# Patient Record
Sex: Male | Born: 1935 | Race: White | Hispanic: No | Marital: Married | Smoking: Former smoker
Health system: Southern US, Community
[De-identification: ages and names within clinical notes are randomized; demographics above are authoritative.]

## PROBLEM LIST (undated history)

## (undated) DIAGNOSIS — Z955 Presence of coronary angioplasty implant and graft: Secondary | ICD-10-CM

## (undated) HISTORY — DX: Presence of coronary angioplasty implant and graft: Z95.5

## (undated) HISTORY — PX: TONSILLECTOMY: SUR1361

## (undated) HISTORY — PX: CORONARY STENT PLACEMENT: SHX1402

---

## 2011-10-07 DIAGNOSIS — M25569 Pain in unspecified knee: Secondary | ICD-10-CM | POA: Diagnosis not present

## 2011-11-10 DIAGNOSIS — M1710 Unilateral primary osteoarthritis, unspecified knee: Secondary | ICD-10-CM | POA: Diagnosis not present

## 2011-11-10 DIAGNOSIS — S82109A Unspecified fracture of upper end of unspecified tibia, initial encounter for closed fracture: Secondary | ICD-10-CM | POA: Diagnosis not present

## 2011-11-10 DIAGNOSIS — M25569 Pain in unspecified knee: Secondary | ICD-10-CM | POA: Diagnosis not present

## 2011-11-10 DIAGNOSIS — IMO0002 Reserved for concepts with insufficient information to code with codable children: Secondary | ICD-10-CM | POA: Diagnosis not present

## 2011-11-10 DIAGNOSIS — M7120 Synovial cyst of popliteal space [Baker], unspecified knee: Secondary | ICD-10-CM | POA: Diagnosis not present

## 2011-11-11 DIAGNOSIS — S82109A Unspecified fracture of upper end of unspecified tibia, initial encounter for closed fracture: Secondary | ICD-10-CM | POA: Diagnosis not present

## 2011-12-02 DIAGNOSIS — S82109A Unspecified fracture of upper end of unspecified tibia, initial encounter for closed fracture: Secondary | ICD-10-CM | POA: Diagnosis not present

## 2012-01-05 DIAGNOSIS — M7989 Other specified soft tissue disorders: Secondary | ICD-10-CM | POA: Diagnosis not present

## 2012-01-05 DIAGNOSIS — M7120 Synovial cyst of popliteal space [Baker], unspecified knee: Secondary | ICD-10-CM | POA: Diagnosis not present

## 2012-01-05 DIAGNOSIS — M25469 Effusion, unspecified knee: Secondary | ICD-10-CM | POA: Diagnosis not present

## 2012-01-05 DIAGNOSIS — M79609 Pain in unspecified limb: Secondary | ICD-10-CM | POA: Diagnosis not present

## 2012-02-03 DIAGNOSIS — M25469 Effusion, unspecified knee: Secondary | ICD-10-CM | POA: Diagnosis not present

## 2012-02-08 DIAGNOSIS — M25569 Pain in unspecified knee: Secondary | ICD-10-CM | POA: Diagnosis not present

## 2012-03-21 DIAGNOSIS — N390 Urinary tract infection, site not specified: Secondary | ICD-10-CM | POA: Diagnosis not present

## 2012-03-21 DIAGNOSIS — N400 Enlarged prostate without lower urinary tract symptoms: Secondary | ICD-10-CM | POA: Diagnosis not present

## 2012-03-21 DIAGNOSIS — R109 Unspecified abdominal pain: Secondary | ICD-10-CM | POA: Diagnosis not present

## 2012-03-21 DIAGNOSIS — R351 Nocturia: Secondary | ICD-10-CM | POA: Diagnosis not present

## 2012-04-04 DIAGNOSIS — N411 Chronic prostatitis: Secondary | ICD-10-CM | POA: Diagnosis not present

## 2012-04-04 DIAGNOSIS — N400 Enlarged prostate without lower urinary tract symptoms: Secondary | ICD-10-CM | POA: Diagnosis not present

## 2012-04-04 DIAGNOSIS — R109 Unspecified abdominal pain: Secondary | ICD-10-CM | POA: Diagnosis not present

## 2012-04-04 DIAGNOSIS — N390 Urinary tract infection, site not specified: Secondary | ICD-10-CM | POA: Diagnosis not present

## 2012-06-22 DIAGNOSIS — K219 Gastro-esophageal reflux disease without esophagitis: Secondary | ICD-10-CM | POA: Diagnosis not present

## 2012-06-22 DIAGNOSIS — R002 Palpitations: Secondary | ICD-10-CM | POA: Diagnosis not present

## 2012-06-22 DIAGNOSIS — I2510 Atherosclerotic heart disease of native coronary artery without angina pectoris: Secondary | ICD-10-CM | POA: Diagnosis not present

## 2012-06-22 DIAGNOSIS — I200 Unstable angina: Secondary | ICD-10-CM | POA: Diagnosis not present

## 2012-06-22 DIAGNOSIS — Z87891 Personal history of nicotine dependence: Secondary | ICD-10-CM | POA: Diagnosis not present

## 2012-06-22 DIAGNOSIS — R079 Chest pain, unspecified: Secondary | ICD-10-CM | POA: Diagnosis not present

## 2012-06-22 DIAGNOSIS — E7800 Pure hypercholesterolemia, unspecified: Secondary | ICD-10-CM | POA: Diagnosis not present

## 2012-06-22 DIAGNOSIS — I209 Angina pectoris, unspecified: Secondary | ICD-10-CM | POA: Diagnosis not present

## 2012-06-22 DIAGNOSIS — N289 Disorder of kidney and ureter, unspecified: Secondary | ICD-10-CM | POA: Diagnosis not present

## 2012-06-22 DIAGNOSIS — I498 Other specified cardiac arrhythmias: Secondary | ICD-10-CM | POA: Diagnosis not present

## 2012-06-22 DIAGNOSIS — N182 Chronic kidney disease, stage 2 (mild): Secondary | ICD-10-CM | POA: Diagnosis not present

## 2012-06-23 DIAGNOSIS — I129 Hypertensive chronic kidney disease with stage 1 through stage 4 chronic kidney disease, or unspecified chronic kidney disease: Secondary | ICD-10-CM | POA: Diagnosis not present

## 2012-06-23 DIAGNOSIS — I2510 Atherosclerotic heart disease of native coronary artery without angina pectoris: Secondary | ICD-10-CM | POA: Diagnosis not present

## 2012-06-23 DIAGNOSIS — I200 Unstable angina: Secondary | ICD-10-CM | POA: Diagnosis not present

## 2012-06-23 DIAGNOSIS — Z79899 Other long term (current) drug therapy: Secondary | ICD-10-CM | POA: Diagnosis not present

## 2012-06-23 DIAGNOSIS — Z9889 Other specified postprocedural states: Secondary | ICD-10-CM | POA: Diagnosis not present

## 2012-06-23 DIAGNOSIS — N182 Chronic kidney disease, stage 2 (mild): Secondary | ICD-10-CM | POA: Diagnosis not present

## 2012-06-23 DIAGNOSIS — R079 Chest pain, unspecified: Secondary | ICD-10-CM | POA: Diagnosis not present

## 2012-06-23 DIAGNOSIS — R0789 Other chest pain: Secondary | ICD-10-CM | POA: Diagnosis not present

## 2012-06-23 DIAGNOSIS — N289 Disorder of kidney and ureter, unspecified: Secondary | ICD-10-CM | POA: Diagnosis not present

## 2012-06-23 DIAGNOSIS — K219 Gastro-esophageal reflux disease without esophagitis: Secondary | ICD-10-CM | POA: Diagnosis not present

## 2012-06-23 DIAGNOSIS — E7800 Pure hypercholesterolemia, unspecified: Secondary | ICD-10-CM | POA: Diagnosis not present

## 2012-06-23 DIAGNOSIS — I498 Other specified cardiac arrhythmias: Secondary | ICD-10-CM | POA: Diagnosis not present

## 2012-06-23 DIAGNOSIS — I495 Sick sinus syndrome: Secondary | ICD-10-CM | POA: Diagnosis not present

## 2012-06-23 DIAGNOSIS — Z87891 Personal history of nicotine dependence: Secondary | ICD-10-CM | POA: Diagnosis not present

## 2012-06-23 DIAGNOSIS — I209 Angina pectoris, unspecified: Secondary | ICD-10-CM | POA: Diagnosis not present

## 2012-06-23 DIAGNOSIS — R9431 Abnormal electrocardiogram [ECG] [EKG]: Secondary | ICD-10-CM | POA: Diagnosis not present

## 2012-06-23 DIAGNOSIS — Z7982 Long term (current) use of aspirin: Secondary | ICD-10-CM | POA: Diagnosis not present

## 2012-06-23 DIAGNOSIS — N179 Acute kidney failure, unspecified: Secondary | ICD-10-CM | POA: Diagnosis not present

## 2012-06-23 DIAGNOSIS — R002 Palpitations: Secondary | ICD-10-CM | POA: Diagnosis not present

## 2012-06-24 DIAGNOSIS — R9431 Abnormal electrocardiogram [ECG] [EKG]: Secondary | ICD-10-CM | POA: Diagnosis not present

## 2012-06-26 DIAGNOSIS — I129 Hypertensive chronic kidney disease with stage 1 through stage 4 chronic kidney disease, or unspecified chronic kidney disease: Secondary | ICD-10-CM | POA: Diagnosis not present

## 2012-06-26 DIAGNOSIS — Z7982 Long term (current) use of aspirin: Secondary | ICD-10-CM | POA: Diagnosis not present

## 2012-06-26 DIAGNOSIS — I2510 Atherosclerotic heart disease of native coronary artery without angina pectoris: Secondary | ICD-10-CM | POA: Diagnosis not present

## 2012-06-26 DIAGNOSIS — I209 Angina pectoris, unspecified: Secondary | ICD-10-CM | POA: Diagnosis not present

## 2012-06-26 DIAGNOSIS — N182 Chronic kidney disease, stage 2 (mild): Secondary | ICD-10-CM | POA: Diagnosis not present

## 2012-06-26 DIAGNOSIS — K219 Gastro-esophageal reflux disease without esophagitis: Secondary | ICD-10-CM | POA: Diagnosis not present

## 2012-06-26 DIAGNOSIS — I200 Unstable angina: Secondary | ICD-10-CM | POA: Diagnosis not present

## 2012-06-26 DIAGNOSIS — Z9889 Other specified postprocedural states: Secondary | ICD-10-CM | POA: Diagnosis not present

## 2012-06-26 DIAGNOSIS — Z79899 Other long term (current) drug therapy: Secondary | ICD-10-CM | POA: Diagnosis not present

## 2012-06-27 DIAGNOSIS — I129 Hypertensive chronic kidney disease with stage 1 through stage 4 chronic kidney disease, or unspecified chronic kidney disease: Secondary | ICD-10-CM | POA: Diagnosis not present

## 2012-06-27 DIAGNOSIS — I2510 Atherosclerotic heart disease of native coronary artery without angina pectoris: Secondary | ICD-10-CM | POA: Diagnosis not present

## 2012-06-27 DIAGNOSIS — N182 Chronic kidney disease, stage 2 (mild): Secondary | ICD-10-CM | POA: Diagnosis not present

## 2012-06-27 DIAGNOSIS — K219 Gastro-esophageal reflux disease without esophagitis: Secondary | ICD-10-CM | POA: Diagnosis not present

## 2012-06-27 DIAGNOSIS — I208 Other forms of angina pectoris: Secondary | ICD-10-CM | POA: Diagnosis not present

## 2012-06-27 DIAGNOSIS — I209 Angina pectoris, unspecified: Secondary | ICD-10-CM | POA: Diagnosis not present

## 2012-06-27 DIAGNOSIS — Z79899 Other long term (current) drug therapy: Secondary | ICD-10-CM | POA: Diagnosis not present

## 2012-06-27 DIAGNOSIS — Z9861 Coronary angioplasty status: Secondary | ICD-10-CM | POA: Diagnosis not present

## 2012-07-10 DIAGNOSIS — I2510 Atherosclerotic heart disease of native coronary artery without angina pectoris: Secondary | ICD-10-CM | POA: Diagnosis not present

## 2012-07-10 DIAGNOSIS — K219 Gastro-esophageal reflux disease without esophagitis: Secondary | ICD-10-CM | POA: Diagnosis not present

## 2012-07-10 DIAGNOSIS — H1045 Other chronic allergic conjunctivitis: Secondary | ICD-10-CM | POA: Diagnosis not present

## 2012-07-10 DIAGNOSIS — I200 Unstable angina: Secondary | ICD-10-CM | POA: Diagnosis not present

## 2012-07-10 DIAGNOSIS — R0602 Shortness of breath: Secondary | ICD-10-CM | POA: Diagnosis not present

## 2012-07-10 DIAGNOSIS — Z8679 Personal history of other diseases of the circulatory system: Secondary | ICD-10-CM | POA: Diagnosis not present

## 2012-08-07 DIAGNOSIS — E785 Hyperlipidemia, unspecified: Secondary | ICD-10-CM | POA: Diagnosis not present

## 2012-11-17 DIAGNOSIS — N509 Disorder of male genital organs, unspecified: Secondary | ICD-10-CM | POA: Diagnosis not present

## 2012-11-17 DIAGNOSIS — N411 Chronic prostatitis: Secondary | ICD-10-CM | POA: Diagnosis not present

## 2012-11-17 DIAGNOSIS — N390 Urinary tract infection, site not specified: Secondary | ICD-10-CM | POA: Diagnosis not present

## 2012-12-01 DIAGNOSIS — N390 Urinary tract infection, site not specified: Secondary | ICD-10-CM | POA: Diagnosis not present

## 2012-12-01 DIAGNOSIS — N411 Chronic prostatitis: Secondary | ICD-10-CM | POA: Diagnosis not present

## 2012-12-01 DIAGNOSIS — R109 Unspecified abdominal pain: Secondary | ICD-10-CM | POA: Diagnosis not present

## 2013-05-24 DIAGNOSIS — M4712 Other spondylosis with myelopathy, cervical region: Secondary | ICD-10-CM | POA: Diagnosis not present

## 2013-07-12 DIAGNOSIS — E785 Hyperlipidemia, unspecified: Secondary | ICD-10-CM | POA: Diagnosis not present

## 2013-07-12 DIAGNOSIS — R0602 Shortness of breath: Secondary | ICD-10-CM | POA: Diagnosis not present

## 2013-07-12 DIAGNOSIS — Z87898 Personal history of other specified conditions: Secondary | ICD-10-CM | POA: Diagnosis not present

## 2013-07-12 DIAGNOSIS — I2510 Atherosclerotic heart disease of native coronary artery without angina pectoris: Secondary | ICD-10-CM | POA: Diagnosis not present

## 2013-07-12 DIAGNOSIS — R918 Other nonspecific abnormal finding of lung field: Secondary | ICD-10-CM | POA: Diagnosis not present

## 2013-07-13 DIAGNOSIS — I2510 Atherosclerotic heart disease of native coronary artery without angina pectoris: Secondary | ICD-10-CM | POA: Diagnosis not present

## 2013-08-07 DIAGNOSIS — I2510 Atherosclerotic heart disease of native coronary artery without angina pectoris: Secondary | ICD-10-CM | POA: Diagnosis not present

## 2013-08-07 DIAGNOSIS — R0602 Shortness of breath: Secondary | ICD-10-CM | POA: Diagnosis not present

## 2013-08-29 DIAGNOSIS — H2510 Age-related nuclear cataract, unspecified eye: Secondary | ICD-10-CM | POA: Diagnosis not present

## 2013-09-14 DIAGNOSIS — N411 Chronic prostatitis: Secondary | ICD-10-CM | POA: Diagnosis not present

## 2013-09-14 DIAGNOSIS — R351 Nocturia: Secondary | ICD-10-CM | POA: Diagnosis not present

## 2013-09-14 DIAGNOSIS — N390 Urinary tract infection, site not specified: Secondary | ICD-10-CM | POA: Diagnosis not present

## 2013-12-25 DIAGNOSIS — H04129 Dry eye syndrome of unspecified lacrimal gland: Secondary | ICD-10-CM | POA: Diagnosis not present

## 2013-12-25 DIAGNOSIS — H2589 Other age-related cataract: Secondary | ICD-10-CM | POA: Diagnosis not present

## 2014-01-15 DIAGNOSIS — H268 Other specified cataract: Secondary | ICD-10-CM | POA: Diagnosis not present

## 2014-01-15 DIAGNOSIS — Z7902 Long term (current) use of antithrombotics/antiplatelets: Secondary | ICD-10-CM | POA: Diagnosis not present

## 2014-01-15 DIAGNOSIS — I2510 Atherosclerotic heart disease of native coronary artery without angina pectoris: Secondary | ICD-10-CM | POA: Diagnosis not present

## 2014-01-15 DIAGNOSIS — Z9861 Coronary angioplasty status: Secondary | ICD-10-CM | POA: Diagnosis not present

## 2014-01-15 DIAGNOSIS — I252 Old myocardial infarction: Secondary | ICD-10-CM | POA: Diagnosis not present

## 2014-01-15 DIAGNOSIS — Z7982 Long term (current) use of aspirin: Secondary | ICD-10-CM | POA: Diagnosis not present

## 2014-01-15 DIAGNOSIS — H2510 Age-related nuclear cataract, unspecified eye: Secondary | ICD-10-CM | POA: Diagnosis not present

## 2014-01-15 DIAGNOSIS — H2589 Other age-related cataract: Secondary | ICD-10-CM | POA: Diagnosis not present

## 2014-01-15 DIAGNOSIS — H269 Unspecified cataract: Secondary | ICD-10-CM | POA: Diagnosis not present

## 2014-01-15 DIAGNOSIS — Z87891 Personal history of nicotine dependence: Secondary | ICD-10-CM | POA: Diagnosis not present

## 2014-01-15 DIAGNOSIS — I10 Essential (primary) hypertension: Secondary | ICD-10-CM | POA: Diagnosis not present

## 2014-01-15 DIAGNOSIS — Z79899 Other long term (current) drug therapy: Secondary | ICD-10-CM | POA: Diagnosis not present

## 2014-01-22 DIAGNOSIS — H04129 Dry eye syndrome of unspecified lacrimal gland: Secondary | ICD-10-CM | POA: Diagnosis not present

## 2014-02-19 DIAGNOSIS — H259 Unspecified age-related cataract: Secondary | ICD-10-CM | POA: Diagnosis not present

## 2014-02-19 DIAGNOSIS — I2510 Atherosclerotic heart disease of native coronary artery without angina pectoris: Secondary | ICD-10-CM | POA: Diagnosis not present

## 2014-02-19 DIAGNOSIS — Z79899 Other long term (current) drug therapy: Secondary | ICD-10-CM | POA: Diagnosis not present

## 2014-02-19 DIAGNOSIS — Z7902 Long term (current) use of antithrombotics/antiplatelets: Secondary | ICD-10-CM | POA: Diagnosis not present

## 2014-02-19 DIAGNOSIS — Z7982 Long term (current) use of aspirin: Secondary | ICD-10-CM | POA: Diagnosis not present

## 2014-02-19 DIAGNOSIS — H2589 Other age-related cataract: Secondary | ICD-10-CM | POA: Diagnosis not present

## 2014-02-19 DIAGNOSIS — H2510 Age-related nuclear cataract, unspecified eye: Secondary | ICD-10-CM | POA: Diagnosis not present

## 2014-02-19 DIAGNOSIS — H9190 Unspecified hearing loss, unspecified ear: Secondary | ICD-10-CM | POA: Diagnosis not present

## 2014-02-19 DIAGNOSIS — Z9861 Coronary angioplasty status: Secondary | ICD-10-CM | POA: Diagnosis not present

## 2014-03-26 DIAGNOSIS — I2510 Atherosclerotic heart disease of native coronary artery without angina pectoris: Secondary | ICD-10-CM | POA: Diagnosis not present

## 2014-03-26 DIAGNOSIS — E785 Hyperlipidemia, unspecified: Secondary | ICD-10-CM | POA: Diagnosis not present

## 2014-08-27 DIAGNOSIS — Z961 Presence of intraocular lens: Secondary | ICD-10-CM | POA: Diagnosis not present

## 2014-08-27 DIAGNOSIS — H10412 Chronic giant papillary conjunctivitis, left eye: Secondary | ICD-10-CM | POA: Diagnosis not present

## 2014-09-16 DIAGNOSIS — N3090 Cystitis, unspecified without hematuria: Secondary | ICD-10-CM | POA: Diagnosis not present

## 2014-09-16 DIAGNOSIS — N318 Other neuromuscular dysfunction of bladder: Secondary | ICD-10-CM | POA: Diagnosis not present

## 2014-09-16 DIAGNOSIS — N400 Enlarged prostate without lower urinary tract symptoms: Secondary | ICD-10-CM | POA: Diagnosis not present

## 2015-01-21 DIAGNOSIS — R070 Pain in throat: Secondary | ICD-10-CM | POA: Diagnosis not present

## 2015-08-07 DIAGNOSIS — I25119 Atherosclerotic heart disease of native coronary artery with unspecified angina pectoris: Secondary | ICD-10-CM | POA: Diagnosis not present

## 2015-08-07 DIAGNOSIS — E785 Hyperlipidemia, unspecified: Secondary | ICD-10-CM | POA: Diagnosis not present

## 2015-08-07 DIAGNOSIS — R001 Bradycardia, unspecified: Secondary | ICD-10-CM | POA: Diagnosis not present

## 2015-08-14 DIAGNOSIS — L989 Disorder of the skin and subcutaneous tissue, unspecified: Secondary | ICD-10-CM | POA: Diagnosis not present

## 2015-08-14 DIAGNOSIS — J029 Acute pharyngitis, unspecified: Secondary | ICD-10-CM | POA: Diagnosis not present

## 2015-08-18 DIAGNOSIS — C44319 Basal cell carcinoma of skin of other parts of face: Secondary | ICD-10-CM | POA: Diagnosis not present

## 2015-08-22 DIAGNOSIS — C44319 Basal cell carcinoma of skin of other parts of face: Secondary | ICD-10-CM | POA: Diagnosis not present

## 2015-08-22 DIAGNOSIS — L578 Other skin changes due to chronic exposure to nonionizing radiation: Secondary | ICD-10-CM | POA: Diagnosis not present

## 2015-08-22 DIAGNOSIS — L570 Actinic keratosis: Secondary | ICD-10-CM | POA: Diagnosis not present

## 2015-09-01 DIAGNOSIS — J029 Acute pharyngitis, unspecified: Secondary | ICD-10-CM | POA: Diagnosis not present

## 2015-09-01 DIAGNOSIS — K219 Gastro-esophageal reflux disease without esophagitis: Secondary | ICD-10-CM | POA: Diagnosis not present

## 2015-09-16 DIAGNOSIS — N3020 Other chronic cystitis without hematuria: Secondary | ICD-10-CM | POA: Diagnosis not present

## 2015-09-16 DIAGNOSIS — N400 Enlarged prostate without lower urinary tract symptoms: Secondary | ICD-10-CM | POA: Diagnosis not present

## 2015-09-16 DIAGNOSIS — N318 Other neuromuscular dysfunction of bladder: Secondary | ICD-10-CM | POA: Diagnosis not present

## 2015-09-16 DIAGNOSIS — N401 Enlarged prostate with lower urinary tract symptoms: Secondary | ICD-10-CM | POA: Diagnosis not present

## 2015-10-29 DIAGNOSIS — C44329 Squamous cell carcinoma of skin of other parts of face: Secondary | ICD-10-CM | POA: Diagnosis not present

## 2016-03-08 DIAGNOSIS — C44119 Basal cell carcinoma of skin of left eyelid, including canthus: Secondary | ICD-10-CM | POA: Diagnosis not present

## 2016-06-28 DIAGNOSIS — Z955 Presence of coronary angioplasty implant and graft: Secondary | ICD-10-CM | POA: Diagnosis not present

## 2016-06-28 DIAGNOSIS — E785 Hyperlipidemia, unspecified: Secondary | ICD-10-CM | POA: Diagnosis not present

## 2016-06-28 DIAGNOSIS — I2510 Atherosclerotic heart disease of native coronary artery without angina pectoris: Secondary | ICD-10-CM | POA: Diagnosis not present

## 2016-06-28 DIAGNOSIS — R9431 Abnormal electrocardiogram [ECG] [EKG]: Secondary | ICD-10-CM | POA: Diagnosis not present

## 2016-06-28 DIAGNOSIS — R5383 Other fatigue: Secondary | ICD-10-CM | POA: Diagnosis not present

## 2016-06-28 DIAGNOSIS — R0602 Shortness of breath: Secondary | ICD-10-CM | POA: Diagnosis not present

## 2016-06-28 DIAGNOSIS — I429 Cardiomyopathy, unspecified: Secondary | ICD-10-CM | POA: Diagnosis not present

## 2016-06-28 DIAGNOSIS — Z8249 Family history of ischemic heart disease and other diseases of the circulatory system: Secondary | ICD-10-CM | POA: Diagnosis not present

## 2016-06-28 DIAGNOSIS — I739 Peripheral vascular disease, unspecified: Secondary | ICD-10-CM | POA: Diagnosis not present

## 2016-06-28 DIAGNOSIS — R600 Localized edema: Secondary | ICD-10-CM | POA: Diagnosis not present

## 2016-06-29 DIAGNOSIS — R600 Localized edema: Secondary | ICD-10-CM | POA: Diagnosis not present

## 2016-06-29 DIAGNOSIS — Z955 Presence of coronary angioplasty implant and graft: Secondary | ICD-10-CM | POA: Diagnosis not present

## 2016-06-29 DIAGNOSIS — I739 Peripheral vascular disease, unspecified: Secondary | ICD-10-CM | POA: Diagnosis not present

## 2016-06-29 DIAGNOSIS — R9431 Abnormal electrocardiogram [ECG] [EKG]: Secondary | ICD-10-CM | POA: Diagnosis not present

## 2016-06-29 DIAGNOSIS — I429 Cardiomyopathy, unspecified: Secondary | ICD-10-CM | POA: Diagnosis not present

## 2016-06-29 DIAGNOSIS — E785 Hyperlipidemia, unspecified: Secondary | ICD-10-CM | POA: Diagnosis not present

## 2016-06-29 DIAGNOSIS — Z8249 Family history of ischemic heart disease and other diseases of the circulatory system: Secondary | ICD-10-CM | POA: Diagnosis not present

## 2016-06-29 DIAGNOSIS — R0602 Shortness of breath: Secondary | ICD-10-CM | POA: Diagnosis not present

## 2016-06-29 DIAGNOSIS — I2510 Atherosclerotic heart disease of native coronary artery without angina pectoris: Secondary | ICD-10-CM | POA: Diagnosis not present

## 2016-06-29 DIAGNOSIS — R5383 Other fatigue: Secondary | ICD-10-CM | POA: Diagnosis not present

## 2016-07-02 DIAGNOSIS — R062 Wheezing: Secondary | ICD-10-CM | POA: Diagnosis not present

## 2016-07-02 DIAGNOSIS — R05 Cough: Secondary | ICD-10-CM | POA: Diagnosis not present

## 2016-07-02 DIAGNOSIS — I429 Cardiomyopathy, unspecified: Secondary | ICD-10-CM | POA: Diagnosis not present

## 2016-07-02 DIAGNOSIS — R5383 Other fatigue: Secondary | ICD-10-CM | POA: Diagnosis not present

## 2016-07-02 DIAGNOSIS — R9431 Abnormal electrocardiogram [ECG] [EKG]: Secondary | ICD-10-CM | POA: Diagnosis not present

## 2016-07-02 DIAGNOSIS — R931 Abnormal findings on diagnostic imaging of heart and coronary circulation: Secondary | ICD-10-CM | POA: Diagnosis not present

## 2016-07-02 DIAGNOSIS — N289 Disorder of kidney and ureter, unspecified: Secondary | ICD-10-CM | POA: Diagnosis not present

## 2016-07-02 DIAGNOSIS — Z955 Presence of coronary angioplasty implant and graft: Secondary | ICD-10-CM | POA: Diagnosis not present

## 2016-07-02 DIAGNOSIS — R0609 Other forms of dyspnea: Secondary | ICD-10-CM | POA: Diagnosis not present

## 2016-07-02 DIAGNOSIS — E785 Hyperlipidemia, unspecified: Secondary | ICD-10-CM | POA: Diagnosis not present

## 2016-07-02 DIAGNOSIS — I2510 Atherosclerotic heart disease of native coronary artery without angina pectoris: Secondary | ICD-10-CM | POA: Diagnosis not present

## 2016-07-02 DIAGNOSIS — Z8249 Family history of ischemic heart disease and other diseases of the circulatory system: Secondary | ICD-10-CM | POA: Diagnosis not present

## 2016-07-16 DIAGNOSIS — Z1389 Encounter for screening for other disorder: Secondary | ICD-10-CM | POA: Diagnosis not present

## 2016-07-16 DIAGNOSIS — R0602 Shortness of breath: Secondary | ICD-10-CM | POA: Diagnosis not present

## 2016-07-16 DIAGNOSIS — I7300 Raynaud's syndrome without gangrene: Secondary | ICD-10-CM | POA: Diagnosis not present

## 2016-07-16 DIAGNOSIS — R05 Cough: Secondary | ICD-10-CM | POA: Diagnosis not present

## 2016-07-16 DIAGNOSIS — J8410 Pulmonary fibrosis, unspecified: Secondary | ICD-10-CM | POA: Diagnosis not present

## 2016-08-30 DIAGNOSIS — R931 Abnormal findings on diagnostic imaging of heart and coronary circulation: Secondary | ICD-10-CM | POA: Diagnosis not present

## 2016-08-30 DIAGNOSIS — I2510 Atherosclerotic heart disease of native coronary artery without angina pectoris: Secondary | ICD-10-CM | POA: Diagnosis not present

## 2016-08-30 DIAGNOSIS — R5383 Other fatigue: Secondary | ICD-10-CM | POA: Diagnosis not present

## 2016-08-30 DIAGNOSIS — R9431 Abnormal electrocardiogram [ECG] [EKG]: Secondary | ICD-10-CM | POA: Diagnosis not present

## 2016-08-30 DIAGNOSIS — R0609 Other forms of dyspnea: Secondary | ICD-10-CM | POA: Diagnosis not present

## 2016-08-30 DIAGNOSIS — I429 Cardiomyopathy, unspecified: Secondary | ICD-10-CM | POA: Diagnosis not present

## 2016-08-30 DIAGNOSIS — Z955 Presence of coronary angioplasty implant and graft: Secondary | ICD-10-CM | POA: Diagnosis not present

## 2016-09-10 DIAGNOSIS — J8410 Pulmonary fibrosis, unspecified: Secondary | ICD-10-CM | POA: Diagnosis not present

## 2016-09-10 DIAGNOSIS — M26622 Arthralgia of left temporomandibular joint: Secondary | ICD-10-CM | POA: Diagnosis not present

## 2016-09-10 DIAGNOSIS — I509 Heart failure, unspecified: Secondary | ICD-10-CM | POA: Diagnosis not present

## 2016-09-10 DIAGNOSIS — M4722 Other spondylosis with radiculopathy, cervical region: Secondary | ICD-10-CM | POA: Diagnosis not present

## 2016-09-14 DIAGNOSIS — N401 Enlarged prostate with lower urinary tract symptoms: Secondary | ICD-10-CM | POA: Diagnosis not present

## 2016-09-14 DIAGNOSIS — N3020 Other chronic cystitis without hematuria: Secondary | ICD-10-CM | POA: Diagnosis not present

## 2016-09-14 DIAGNOSIS — R351 Nocturia: Secondary | ICD-10-CM | POA: Diagnosis not present

## 2016-09-14 DIAGNOSIS — N318 Other neuromuscular dysfunction of bladder: Secondary | ICD-10-CM | POA: Diagnosis not present

## 2016-10-07 DIAGNOSIS — L821 Other seborrheic keratosis: Secondary | ICD-10-CM | POA: Diagnosis not present

## 2016-10-07 DIAGNOSIS — L814 Other melanin hyperpigmentation: Secondary | ICD-10-CM | POA: Diagnosis not present

## 2016-10-07 DIAGNOSIS — L570 Actinic keratosis: Secondary | ICD-10-CM | POA: Diagnosis not present

## 2017-01-03 DIAGNOSIS — H10412 Chronic giant papillary conjunctivitis, left eye: Secondary | ICD-10-CM | POA: Diagnosis not present

## 2017-03-01 DIAGNOSIS — N3020 Other chronic cystitis without hematuria: Secondary | ICD-10-CM | POA: Diagnosis not present

## 2017-03-01 DIAGNOSIS — N410 Acute prostatitis: Secondary | ICD-10-CM | POA: Diagnosis not present

## 2017-03-15 DIAGNOSIS — N3020 Other chronic cystitis without hematuria: Secondary | ICD-10-CM | POA: Diagnosis not present

## 2017-03-15 DIAGNOSIS — N410 Acute prostatitis: Secondary | ICD-10-CM | POA: Diagnosis not present

## 2017-05-18 DIAGNOSIS — I429 Cardiomyopathy, unspecified: Secondary | ICD-10-CM | POA: Diagnosis not present

## 2017-05-18 DIAGNOSIS — I2510 Atherosclerotic heart disease of native coronary artery without angina pectoris: Secondary | ICD-10-CM | POA: Diagnosis not present

## 2017-05-18 DIAGNOSIS — J929 Pleural plaque without asbestos: Secondary | ICD-10-CM | POA: Diagnosis not present

## 2017-05-18 DIAGNOSIS — R0989 Other specified symptoms and signs involving the circulatory and respiratory systems: Secondary | ICD-10-CM | POA: Diagnosis not present

## 2017-05-18 DIAGNOSIS — R0602 Shortness of breath: Secondary | ICD-10-CM | POA: Diagnosis not present

## 2017-05-18 DIAGNOSIS — R05 Cough: Secondary | ICD-10-CM | POA: Diagnosis not present

## 2017-05-23 DIAGNOSIS — Z1331 Encounter for screening for depression: Secondary | ICD-10-CM | POA: Diagnosis not present

## 2017-05-23 DIAGNOSIS — Z0000 Encounter for general adult medical examination without abnormal findings: Secondary | ICD-10-CM | POA: Diagnosis not present

## 2017-05-23 DIAGNOSIS — R0609 Other forms of dyspnea: Secondary | ICD-10-CM | POA: Diagnosis not present

## 2017-05-23 DIAGNOSIS — Z6825 Body mass index (BMI) 25.0-25.9, adult: Secondary | ICD-10-CM | POA: Diagnosis not present

## 2017-05-23 LAB — PULMONARY FUNCTION TEST

## 2017-06-06 DIAGNOSIS — J984 Other disorders of lung: Secondary | ICD-10-CM | POA: Diagnosis not present

## 2017-06-06 DIAGNOSIS — Z6825 Body mass index (BMI) 25.0-25.9, adult: Secondary | ICD-10-CM | POA: Diagnosis not present

## 2017-06-06 DIAGNOSIS — J449 Chronic obstructive pulmonary disease, unspecified: Secondary | ICD-10-CM | POA: Diagnosis not present

## 2017-06-06 LAB — PULMONARY FUNCTION TEST

## 2017-06-24 ENCOUNTER — Other Ambulatory Visit (INDEPENDENT_AMBULATORY_CARE_PROVIDER_SITE_OTHER): Payer: Medicare Other

## 2017-06-24 ENCOUNTER — Encounter: Payer: Self-pay | Admitting: Pulmonary Disease

## 2017-06-24 ENCOUNTER — Ambulatory Visit (INDEPENDENT_AMBULATORY_CARE_PROVIDER_SITE_OTHER): Payer: Medicare Other | Admitting: Pulmonary Disease

## 2017-06-24 DIAGNOSIS — J849 Interstitial pulmonary disease, unspecified: Secondary | ICD-10-CM

## 2017-06-24 LAB — CBC WITH DIFFERENTIAL/PLATELET
Basophils Absolute: 0 10*3/uL (ref 0.0–0.1)
Basophils Relative: 0.4 % (ref 0.0–3.0)
EOS ABS: 0.2 10*3/uL (ref 0.0–0.7)
Eosinophils Relative: 2.6 % (ref 0.0–5.0)
HCT: 40.4 % (ref 39.0–52.0)
HEMOGLOBIN: 13.5 g/dL (ref 13.0–17.0)
Lymphocytes Relative: 24.1 % (ref 12.0–46.0)
Lymphs Abs: 2 10*3/uL (ref 0.7–4.0)
MCHC: 33.3 g/dL (ref 30.0–36.0)
MCV: 89 fl (ref 78.0–100.0)
MONO ABS: 0.6 10*3/uL (ref 0.1–1.0)
Monocytes Relative: 7.6 % (ref 3.0–12.0)
Neutro Abs: 5.6 10*3/uL (ref 1.4–7.7)
Neutrophils Relative %: 65.3 % (ref 43.0–77.0)
Platelets: 193 10*3/uL (ref 150.0–400.0)
RBC: 4.54 Mil/uL (ref 4.22–5.81)
RDW: 13.8 % (ref 11.5–15.5)
WBC: 8.5 10*3/uL (ref 4.0–10.5)

## 2017-06-24 LAB — COMPREHENSIVE METABOLIC PANEL
ALK PHOS: 64 U/L (ref 39–117)
ALT: 11 U/L (ref 0–53)
AST: 18 U/L (ref 0–37)
Albumin: 4.4 g/dL (ref 3.5–5.2)
BUN: 27 mg/dL — AB (ref 6–23)
CO2: 33 mEq/L — ABNORMAL HIGH (ref 19–32)
CREATININE: 1.58 mg/dL — AB (ref 0.40–1.50)
Calcium: 9.8 mg/dL (ref 8.4–10.5)
Chloride: 102 mEq/L (ref 96–112)
GFR: 44.23 mL/min — ABNORMAL LOW (ref >60.00)
Glucose, Bld: 109 mg/dL — ABNORMAL HIGH (ref 70–99)
Potassium: 4.1 mEq/L (ref 3.5–5.1)
SODIUM: 142 meq/L (ref 135–145)
TOTAL PROTEIN: 7.4 g/dL (ref 6.0–8.3)
Total Bilirubin: 0.4 mg/dL (ref 0.2–1.2)

## 2017-06-24 LAB — SEDIMENTATION RATE: Sed Rate: 5 mm/hr (ref 0–20)

## 2017-06-24 NOTE — Patient Instructions (Signed)
Will try to get copy of breathing tests from Dr. Bary Leriche office  Lab tests today  Will schedule CT chest  Follow up in 2 to 3 weeks with Dr. Halford Chessman or Nurse Practioner

## 2017-06-24 NOTE — Progress Notes (Signed)
[facility]  Chief Complaint  Patient presents with  . Advice Only    Patient has been experiencing SOB since november. 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 Referred by PCP, Dr. Nicki Reaper. He also has a productive cough(clear) and chest pain when working in dust.     Vital signs: BP 114/80 (BP Location: Left Arm, Cuff Size: Normal)   Pulse 88   Ht 6\' 1"  (1.854 m)   Wt 202 lb (91.6 kg)   SpO2 98%   BMI 26.65 kg/m   History of Present Illness: Victor Barry is a 84 y.o. male former smoker with dyspnea and cough.  He has noticed trouble with a cough for past several months.  This has been associated with a cough with clear sputum.  He had CXR report from 05/18/17 showed pulmonary fibrosis.  He reports having a breathing test and was told his lung function was low.  He works on a farm.  He notices more cough when around dust.  He was started on advair, and feels this helps.  He quit smoking years ago.  He thinks he had asbestos exposure from insulation years ago.  He denies history of pneumonia or exposure to tuberculosis.    He denies fever, skin rash, joint swelling.  He has never been told he had asthma, or COPD.   Physical Exam:  General - pleasant Eyes - pupils reactive ENT - no sinus tenderness, no oral exudate, no LAN Cardiac - regular, no murmur Chest - faint basilar crackles b/l Abd - soft, non tender Ext - no edema Skin - dry skin Neuro - normal strength Psych - normal mood  Discussion:  84 year old male with progressive cough and dyspnea.  His recent chest xray shows changes suggestive of pulmonary fibrosis.  He has possible exposures from farm work.  He reports remote history of smoking and possible asbestos exposure.  Assessment/Plan:  Interstitial lung disease. - will arrange for lab testing today - will arrange for high resolution CT chest - will get copy of his recent breathing tests - he can continue  advair for now   Patient Instructions  Will try to get copy of breathing tests from Dr. Bary Leriche office  Lab tests today  Will schedule CT chest  Follow up in 2 to 3 weeks with Dr. Halford Chessman or Nurse Practioner    Chesley Mires, MD [place] 06/24/2017, 2:29 PM Pager:  [phone]  Flow Sheet  Pulmonary tests:  Cardiac tests:  Review of Systems: Short of breath, cough, sore throat, headaches, swelling.  Remainder of 12 point ROS negative.  Past Medical History: He  has a past medical history of Coronary stent patent.  Past Surgical History: He  has a past surgical history that includes Tonsillectomy and Coronary stent placement.  Family History: His is negative for interstitial lung disease.  Social History: He  reports that he quit smoking about 39 years ago. His smoking use included cigarettes. He has a 25.00 pack-year smoking history. he has never used smokeless tobacco. He reports that he does not drink alcohol or use drugs.  Medications: Allergies as of 06/24/2017   No Known Allergies     Medication List        Accurate as of 06/24/17  2:29 PM. Always use your most recent med list.          aspirin EC 81 MG tablet Take 81 mg by mouth daily.   clopidogrel  75 MG tablet Commonly known as:  PLAVIX Take 75 mg by mouth daily.   esomeprazole 40 MG capsule Commonly known as:  NEXIUM Take 40 mg by mouth daily at 12 noon.   Fluticasone-Salmeterol 250-50 MCG/DOSE Aepb Commonly known as:  ADVAIR Inhale 1 puff into the lungs 2 (two) times daily.

## 2017-06-25 LAB — ANA W/REFLEX IF POSITIVE: Anti Nuclear Antibody(ANA): NEGATIVE

## 2017-06-30 LAB — JO-1 ANTIBODY-IGG: Jo-1 Autoabs: <1 AI

## 2017-06-30 LAB — HYPERSENSITIVITY PNUEMONITIS PROFILE
ASPERGILLUS FUMIGATUS: NEGATIVE
FAENIA RETIVIRGULA: NEGATIVE
Pigeon Serum: NEGATIVE
S. VIRIDIS: NEGATIVE
T. CANDIDUS: NEGATIVE
T. VULGARIS: NEGATIVE

## 2017-06-30 LAB — ANTI-SCLERODERMA ANTIBODY: Scleroderma (Scl-70) (ENA) Antibody, IgG: <1 AI

## 2017-06-30 LAB — SJOGREN'S SYNDROME ANTIBODS(SSA + SSB)
SSA (Ro) (ENA) Antibody, IgG: <1 AI
SSB (La) (ENA) Antibody, IgG: <1 AI

## 2017-06-30 LAB — RHEUMATOID FACTOR

## 2017-07-06 ENCOUNTER — Ambulatory Visit (INDEPENDENT_AMBULATORY_CARE_PROVIDER_SITE_OTHER)
Admission: RE | Admit: 2017-07-06 | Discharge: 2017-07-06 | Disposition: A | Discharge status: Home / Self Care | Payer: Medicare Other | Source: Ambulatory Visit | Attending: Pulmonary Disease | Admitting: Pulmonary Disease

## 2017-07-06 DIAGNOSIS — R918 Other nonspecific abnormal finding of lung field: Secondary | ICD-10-CM | POA: Diagnosis not present

## 2017-07-06 DIAGNOSIS — J849 Interstitial pulmonary disease, unspecified: Secondary | ICD-10-CM

## 2017-07-16 ENCOUNTER — Encounter: Payer: Self-pay | Admitting: Pulmonary Disease

## 2017-07-16 DIAGNOSIS — J849 Interstitial pulmonary disease, unspecified: Secondary | ICD-10-CM | POA: Insufficient documentation

## 2017-07-29 ENCOUNTER — Ambulatory Visit (INDEPENDENT_AMBULATORY_CARE_PROVIDER_SITE_OTHER): Payer: Medicare Other | Admitting: Pulmonary Disease

## 2017-07-29 ENCOUNTER — Encounter: Payer: Self-pay | Admitting: Pulmonary Disease

## 2017-07-29 VITALS — BP 124/74 | HR 59 | Ht 73.0 in | Wt 203.0 lb

## 2017-07-29 DIAGNOSIS — J84112 Idiopathic pulmonary fibrosis: Secondary | ICD-10-CM | POA: Diagnosis not present

## 2017-07-29 NOTE — Patient Instructions (Signed)
Follow up in 3 months

## 2017-07-29 NOTE — Progress Notes (Signed)
[facility]  Chief Complaint  Patient presents with  . Follow-up    Pt would like the results of labs and CT scan    Vital signs: BP 124/74 (BP Location: Left Arm, Cuff Size: Normal)   Pulse (!) 59   Ht 6\' 1"  (1.854 m)   Wt 203 lb (92.1 kg)   SpO2 94%   BMI 26.78 kg/m   History of Present Illness: Victor Barry is a 84 y.o. male former smoker with pulmonary fibrosis.  He is here to review his labs and CT chest.  Labs were negative.  CT chest consistent with IPF.  He uses advair and feels this helps his cough.  He gets winded with too much exertion.  No having skin rash, fever, sputum, chest pain, hemoptysis, or leg swelling.   Physical Exam:  General - pleasant Eyes - pupils reactive ENT - no sinus tenderness, no oral exudate, no LAN Cardiac - regular, no murmur Chest - faint basilar rales Abd - soft, non tender Ext - no edema Skin - no rashes Neuro - normal strength Psych - normal mood   Assessment/Plan:  Idiopathic pulmonary fibrosis. - reviewed CT chest and labs with him - discussed natural progression of disease - review anti-fibrotic therapy options - he does not want to pursue specific medical therapy or additional pulmonary function testing for his IPF at this time - okay to continue advair since he has symptomatic benefit  Time spent 27 minutes  Patient Instructions  Follow up in 3 months    Chesley Mires, MD [place] 07/29/2017, 11:23 AM Pager:  [phone]  Flow Sheet  Pulmonary tests: Spirometry 06/06/17 >> FEV1 1.99 (64%), FEV1% 68 Serology 06/24/17 >> SCL 70, anti Jo, SSA/SSB, RF, ANA all negative HRCT chest 07/06/17 >> subpleural reticulation, traction BTC, early honeycombing, scattered nodules up to 5 mm  Cardiac tests:  Past Medical History: He  has a past medical history of Coronary stent patent.  Past Surgical History: He  has a past surgical history that includes  Tonsillectomy and Coronary stent placement.  Family History: His is negative for interstitial lung disease.  Social History: He  reports that he quit smoking about 39 years ago. His smoking use included cigarettes. He has a 25.00 pack-year smoking history. he has never used smokeless tobacco. He reports that he does not drink alcohol or use drugs.  Medications: Allergies as of 07/29/2017   No Known Allergies     Medication List        Accurate as of 07/29/17 11:23 AM. Always use your most recent med list.          aspirin EC 81 MG tablet Take 81 mg by mouth daily.   clopidogrel 75 MG tablet Commonly known as:  PLAVIX Take 75 mg by mouth daily.   esomeprazole 40 MG capsule Commonly known as:  NEXIUM Take 40 mg by mouth daily at 12 noon.   Fluticasone-Salmeterol 250-50 MCG/DOSE Aepb Commonly known as:  ADVAIR Inhale 1 puff into the lungs 2 (two) times daily.

## 2017-09-15 DIAGNOSIS — C44311 Basal cell carcinoma of skin of nose: Secondary | ICD-10-CM | POA: Diagnosis not present

## 2017-09-15 DIAGNOSIS — C44329 Squamous cell carcinoma of skin of other parts of face: Secondary | ICD-10-CM | POA: Diagnosis not present

## 2017-10-11 DIAGNOSIS — L570 Actinic keratosis: Secondary | ICD-10-CM | POA: Diagnosis not present

## 2017-10-28 ENCOUNTER — Ambulatory Visit: Payer: Medicare Other | Admitting: Pulmonary Disease

## 2017-11-02 DIAGNOSIS — Z0389 Encounter for observation for other suspected diseases and conditions ruled out: Secondary | ICD-10-CM | POA: Diagnosis not present

## 2017-11-02 DIAGNOSIS — E876 Hypokalemia: Secondary | ICD-10-CM | POA: Diagnosis present

## 2017-11-02 DIAGNOSIS — Z66 Do not resuscitate: Secondary | ICD-10-CM | POA: Diagnosis present

## 2017-11-02 DIAGNOSIS — J9601 Acute respiratory failure with hypoxia: Secondary | ICD-10-CM | POA: Diagnosis present

## 2017-11-02 DIAGNOSIS — R918 Other nonspecific abnormal finding of lung field: Secondary | ICD-10-CM | POA: Diagnosis present

## 2017-11-02 DIAGNOSIS — Z7951 Long term (current) use of inhaled steroids: Secondary | ICD-10-CM | POA: Diagnosis not present

## 2017-11-02 DIAGNOSIS — N183 Chronic kidney disease, stage 3 (moderate): Secondary | ICD-10-CM | POA: Diagnosis not present

## 2017-11-02 DIAGNOSIS — Z955 Presence of coronary angioplasty implant and graft: Secondary | ICD-10-CM | POA: Diagnosis not present

## 2017-11-02 DIAGNOSIS — J189 Pneumonia, unspecified organism: Secondary | ICD-10-CM | POA: Diagnosis not present

## 2017-11-02 DIAGNOSIS — Z87891 Personal history of nicotine dependence: Secondary | ICD-10-CM | POA: Diagnosis not present

## 2017-11-02 DIAGNOSIS — E785 Hyperlipidemia, unspecified: Secondary | ICD-10-CM | POA: Diagnosis present

## 2017-11-02 DIAGNOSIS — Z7982 Long term (current) use of aspirin: Secondary | ICD-10-CM | POA: Diagnosis not present

## 2017-11-02 DIAGNOSIS — E871 Hypo-osmolality and hyponatremia: Secondary | ICD-10-CM | POA: Diagnosis not present

## 2017-11-02 DIAGNOSIS — R0902 Hypoxemia: Secondary | ICD-10-CM | POA: Diagnosis not present

## 2017-11-02 DIAGNOSIS — I2510 Atherosclerotic heart disease of native coronary artery without angina pectoris: Secondary | ICD-10-CM | POA: Diagnosis present

## 2017-11-02 DIAGNOSIS — J84112 Idiopathic pulmonary fibrosis: Secondary | ICD-10-CM | POA: Diagnosis present

## 2017-11-16 DIAGNOSIS — I2699 Other pulmonary embolism without acute cor pulmonale: Secondary | ICD-10-CM | POA: Diagnosis not present

## 2017-11-16 DIAGNOSIS — R0902 Hypoxemia: Secondary | ICD-10-CM | POA: Diagnosis not present

## 2017-11-16 DIAGNOSIS — I429 Cardiomyopathy, unspecified: Secondary | ICD-10-CM | POA: Diagnosis not present

## 2017-11-16 DIAGNOSIS — E871 Hypo-osmolality and hyponatremia: Secondary | ICD-10-CM | POA: Diagnosis not present

## 2017-11-16 DIAGNOSIS — R0602 Shortness of breath: Secondary | ICD-10-CM | POA: Diagnosis not present

## 2017-11-16 DIAGNOSIS — E785 Hyperlipidemia, unspecified: Secondary | ICD-10-CM | POA: Diagnosis not present

## 2017-11-16 DIAGNOSIS — I517 Cardiomegaly: Secondary | ICD-10-CM | POA: Diagnosis not present

## 2017-11-16 DIAGNOSIS — Z7902 Long term (current) use of antithrombotics/antiplatelets: Secondary | ICD-10-CM | POA: Diagnosis not present

## 2017-11-16 DIAGNOSIS — Z87891 Personal history of nicotine dependence: Secondary | ICD-10-CM | POA: Diagnosis not present

## 2017-11-16 DIAGNOSIS — K219 Gastro-esophageal reflux disease without esophagitis: Secondary | ICD-10-CM | POA: Diagnosis not present

## 2017-11-16 DIAGNOSIS — Z79899 Other long term (current) drug therapy: Secondary | ICD-10-CM | POA: Diagnosis not present

## 2017-11-16 DIAGNOSIS — N183 Chronic kidney disease, stage 3 (moderate): Secondary | ICD-10-CM | POA: Diagnosis not present

## 2017-11-16 DIAGNOSIS — J189 Pneumonia, unspecified organism: Secondary | ICD-10-CM | POA: Diagnosis not present

## 2017-11-16 DIAGNOSIS — K7689 Other specified diseases of liver: Secondary | ICD-10-CM | POA: Diagnosis not present

## 2017-11-16 DIAGNOSIS — R0609 Other forms of dyspnea: Secondary | ICD-10-CM | POA: Diagnosis not present

## 2017-11-16 DIAGNOSIS — J9601 Acute respiratory failure with hypoxia: Secondary | ICD-10-CM | POA: Diagnosis not present

## 2017-11-16 DIAGNOSIS — R5383 Other fatigue: Secondary | ICD-10-CM | POA: Diagnosis not present

## 2017-11-16 DIAGNOSIS — I2510 Atherosclerotic heart disease of native coronary artery without angina pectoris: Secondary | ICD-10-CM | POA: Diagnosis not present

## 2017-11-16 DIAGNOSIS — Z7982 Long term (current) use of aspirin: Secondary | ICD-10-CM | POA: Diagnosis not present

## 2017-11-16 DIAGNOSIS — I739 Peripheral vascular disease, unspecified: Secondary | ICD-10-CM | POA: Diagnosis not present

## 2017-11-16 DIAGNOSIS — Z955 Presence of coronary angioplasty implant and graft: Secondary | ICD-10-CM | POA: Diagnosis not present

## 2017-11-17 DIAGNOSIS — I2699 Other pulmonary embolism without acute cor pulmonale: Secondary | ICD-10-CM | POA: Diagnosis not present

## 2017-11-23 DIAGNOSIS — I2510 Atherosclerotic heart disease of native coronary artery without angina pectoris: Secondary | ICD-10-CM | POA: Diagnosis not present

## 2017-11-23 DIAGNOSIS — Z1339 Encounter for screening examination for other mental health and behavioral disorders: Secondary | ICD-10-CM | POA: Diagnosis not present

## 2017-11-23 DIAGNOSIS — R0609 Other forms of dyspnea: Secondary | ICD-10-CM | POA: Diagnosis not present

## 2017-11-23 DIAGNOSIS — I519 Heart disease, unspecified: Secondary | ICD-10-CM | POA: Diagnosis not present

## 2017-11-23 DIAGNOSIS — R931 Abnormal findings on diagnostic imaging of heart and coronary circulation: Secondary | ICD-10-CM | POA: Diagnosis not present

## 2017-11-23 DIAGNOSIS — R9431 Abnormal electrocardiogram [ECG] [EKG]: Secondary | ICD-10-CM | POA: Diagnosis not present

## 2017-11-23 DIAGNOSIS — I2699 Other pulmonary embolism without acute cor pulmonale: Secondary | ICD-10-CM | POA: Diagnosis not present

## 2017-11-23 DIAGNOSIS — J84112 Idiopathic pulmonary fibrosis: Secondary | ICD-10-CM | POA: Diagnosis not present

## 2017-11-23 DIAGNOSIS — R5383 Other fatigue: Secondary | ICD-10-CM | POA: Diagnosis not present

## 2017-11-23 DIAGNOSIS — J8410 Pulmonary fibrosis, unspecified: Secondary | ICD-10-CM | POA: Diagnosis not present

## 2017-11-23 DIAGNOSIS — J439 Emphysema, unspecified: Secondary | ICD-10-CM | POA: Diagnosis not present

## 2017-12-12 DIAGNOSIS — J8410 Pulmonary fibrosis, unspecified: Secondary | ICD-10-CM | POA: Diagnosis not present

## 2017-12-12 DIAGNOSIS — J310 Chronic rhinitis: Secondary | ICD-10-CM | POA: Diagnosis not present

## 2017-12-29 DIAGNOSIS — J8410 Pulmonary fibrosis, unspecified: Secondary | ICD-10-CM | POA: Diagnosis not present

## 2017-12-29 DIAGNOSIS — I2510 Atherosclerotic heart disease of native coronary artery without angina pectoris: Secondary | ICD-10-CM | POA: Diagnosis not present

## 2018-02-02 DIAGNOSIS — J84112 Idiopathic pulmonary fibrosis: Secondary | ICD-10-CM | POA: Diagnosis not present

## 2018-02-02 DIAGNOSIS — I249 Acute ischemic heart disease, unspecified: Secondary | ICD-10-CM | POA: Diagnosis not present

## 2018-02-02 DIAGNOSIS — I509 Heart failure, unspecified: Secondary | ICD-10-CM | POA: Diagnosis not present

## 2018-02-22 DIAGNOSIS — Z23 Encounter for immunization: Secondary | ICD-10-CM | POA: Diagnosis not present

## 2018-02-22 DIAGNOSIS — I2699 Other pulmonary embolism without acute cor pulmonale: Secondary | ICD-10-CM | POA: Diagnosis not present

## 2018-02-22 DIAGNOSIS — B370 Candidal stomatitis: Secondary | ICD-10-CM | POA: Diagnosis not present

## 2018-02-22 DIAGNOSIS — Z6825 Body mass index (BMI) 25.0-25.9, adult: Secondary | ICD-10-CM | POA: Diagnosis not present

## 2018-03-06 DIAGNOSIS — N183 Chronic kidney disease, stage 3 (moderate): Secondary | ICD-10-CM | POA: Diagnosis not present

## 2018-03-06 DIAGNOSIS — J84112 Idiopathic pulmonary fibrosis: Secondary | ICD-10-CM | POA: Diagnosis not present

## 2018-03-06 DIAGNOSIS — K219 Gastro-esophageal reflux disease without esophagitis: Secondary | ICD-10-CM | POA: Diagnosis not present

## 2018-03-06 DIAGNOSIS — I509 Heart failure, unspecified: Secondary | ICD-10-CM | POA: Diagnosis not present

## 2018-03-22 DIAGNOSIS — Z6826 Body mass index (BMI) 26.0-26.9, adult: Secondary | ICD-10-CM | POA: Diagnosis not present

## 2018-03-22 DIAGNOSIS — R030 Elevated blood-pressure reading, without diagnosis of hypertension: Secondary | ICD-10-CM | POA: Diagnosis not present

## 2018-03-22 DIAGNOSIS — H9392 Unspecified disorder of left ear: Secondary | ICD-10-CM | POA: Diagnosis not present

## 2018-04-06 DIAGNOSIS — K219 Gastro-esophageal reflux disease without esophagitis: Secondary | ICD-10-CM | POA: Diagnosis not present

## 2018-04-06 DIAGNOSIS — I509 Heart failure, unspecified: Secondary | ICD-10-CM | POA: Diagnosis not present

## 2018-05-10 DIAGNOSIS — R5383 Other fatigue: Secondary | ICD-10-CM | POA: Diagnosis not present

## 2018-05-10 DIAGNOSIS — I519 Heart disease, unspecified: Secondary | ICD-10-CM | POA: Diagnosis not present

## 2018-05-10 DIAGNOSIS — R9431 Abnormal electrocardiogram [ECG] [EKG]: Secondary | ICD-10-CM | POA: Diagnosis not present

## 2018-05-10 DIAGNOSIS — R931 Abnormal findings on diagnostic imaging of heart and coronary circulation: Secondary | ICD-10-CM | POA: Diagnosis not present

## 2018-05-10 DIAGNOSIS — R0609 Other forms of dyspnea: Secondary | ICD-10-CM | POA: Diagnosis not present

## 2018-05-10 DIAGNOSIS — I2510 Atherosclerotic heart disease of native coronary artery without angina pectoris: Secondary | ICD-10-CM | POA: Diagnosis not present

## 2018-05-10 DIAGNOSIS — J8410 Pulmonary fibrosis, unspecified: Secondary | ICD-10-CM | POA: Diagnosis not present

## 2018-05-10 DIAGNOSIS — I2699 Other pulmonary embolism without acute cor pulmonale: Secondary | ICD-10-CM | POA: Diagnosis not present

## 2018-05-10 DIAGNOSIS — J439 Emphysema, unspecified: Secondary | ICD-10-CM | POA: Diagnosis not present

## 2018-05-24 DIAGNOSIS — Z79899 Other long term (current) drug therapy: Secondary | ICD-10-CM | POA: Diagnosis not present

## 2018-05-24 DIAGNOSIS — Z0000 Encounter for general adult medical examination without abnormal findings: Secondary | ICD-10-CM | POA: Diagnosis not present

## 2018-05-24 DIAGNOSIS — J84112 Idiopathic pulmonary fibrosis: Secondary | ICD-10-CM | POA: Diagnosis not present

## 2018-05-24 DIAGNOSIS — I509 Heart failure, unspecified: Secondary | ICD-10-CM | POA: Diagnosis not present

## 2018-05-24 DIAGNOSIS — N183 Chronic kidney disease, stage 3 (moderate): Secondary | ICD-10-CM | POA: Diagnosis not present

## 2018-08-05 DIAGNOSIS — N183 Chronic kidney disease, stage 3 (moderate): Secondary | ICD-10-CM | POA: Diagnosis not present

## 2018-08-05 DIAGNOSIS — J84112 Idiopathic pulmonary fibrosis: Secondary | ICD-10-CM | POA: Diagnosis not present

## 2018-11-27 DIAGNOSIS — R911 Solitary pulmonary nodule: Secondary | ICD-10-CM | POA: Diagnosis not present

## 2018-11-27 DIAGNOSIS — I2510 Atherosclerotic heart disease of native coronary artery without angina pectoris: Secondary | ICD-10-CM | POA: Diagnosis not present

## 2018-11-27 DIAGNOSIS — E785 Hyperlipidemia, unspecified: Secondary | ICD-10-CM | POA: Diagnosis not present

## 2018-11-27 DIAGNOSIS — J439 Emphysema, unspecified: Secondary | ICD-10-CM | POA: Diagnosis not present

## 2018-11-27 DIAGNOSIS — I517 Cardiomegaly: Secondary | ICD-10-CM | POA: Diagnosis not present

## 2018-11-27 DIAGNOSIS — Z86711 Personal history of pulmonary embolism: Secondary | ICD-10-CM | POA: Diagnosis not present

## 2018-11-27 DIAGNOSIS — R9389 Abnormal findings on diagnostic imaging of other specified body structures: Secondary | ICD-10-CM | POA: Diagnosis not present

## 2018-11-27 DIAGNOSIS — Z87891 Personal history of nicotine dependence: Secondary | ICD-10-CM | POA: Diagnosis not present

## 2018-11-27 DIAGNOSIS — R0609 Other forms of dyspnea: Secondary | ICD-10-CM | POA: Diagnosis not present

## 2018-11-27 DIAGNOSIS — I519 Heart disease, unspecified: Secondary | ICD-10-CM | POA: Diagnosis not present

## 2018-11-27 DIAGNOSIS — J8410 Pulmonary fibrosis, unspecified: Secondary | ICD-10-CM | POA: Diagnosis not present

## 2018-11-27 DIAGNOSIS — I429 Cardiomyopathy, unspecified: Secondary | ICD-10-CM | POA: Diagnosis not present

## 2018-11-27 DIAGNOSIS — R0602 Shortness of breath: Secondary | ICD-10-CM | POA: Diagnosis not present

## 2018-11-27 DIAGNOSIS — R5383 Other fatigue: Secondary | ICD-10-CM | POA: Diagnosis not present

## 2018-11-27 DIAGNOSIS — R600 Localized edema: Secondary | ICD-10-CM | POA: Diagnosis not present

## 2018-11-27 DIAGNOSIS — Z8249 Family history of ischemic heart disease and other diseases of the circulatory system: Secondary | ICD-10-CM | POA: Diagnosis not present

## 2018-12-08 DIAGNOSIS — Z8249 Family history of ischemic heart disease and other diseases of the circulatory system: Secondary | ICD-10-CM | POA: Diagnosis not present

## 2018-12-08 DIAGNOSIS — R5383 Other fatigue: Secondary | ICD-10-CM | POA: Diagnosis not present

## 2018-12-08 DIAGNOSIS — J8410 Pulmonary fibrosis, unspecified: Secondary | ICD-10-CM | POA: Diagnosis not present

## 2018-12-08 DIAGNOSIS — R0609 Other forms of dyspnea: Secondary | ICD-10-CM | POA: Diagnosis not present

## 2018-12-08 DIAGNOSIS — I519 Heart disease, unspecified: Secondary | ICD-10-CM | POA: Diagnosis not present

## 2018-12-08 DIAGNOSIS — R0602 Shortness of breath: Secondary | ICD-10-CM | POA: Diagnosis not present

## 2018-12-08 DIAGNOSIS — E785 Hyperlipidemia, unspecified: Secondary | ICD-10-CM | POA: Diagnosis not present

## 2018-12-08 DIAGNOSIS — I429 Cardiomyopathy, unspecified: Secondary | ICD-10-CM | POA: Diagnosis not present

## 2018-12-08 DIAGNOSIS — Z87891 Personal history of nicotine dependence: Secondary | ICD-10-CM | POA: Diagnosis not present

## 2018-12-08 DIAGNOSIS — R600 Localized edema: Secondary | ICD-10-CM | POA: Diagnosis not present

## 2018-12-08 DIAGNOSIS — Z86711 Personal history of pulmonary embolism: Secondary | ICD-10-CM | POA: Diagnosis not present

## 2018-12-08 DIAGNOSIS — I2510 Atherosclerotic heart disease of native coronary artery without angina pectoris: Secondary | ICD-10-CM | POA: Diagnosis not present

## 2018-12-26 DIAGNOSIS — E785 Hyperlipidemia, unspecified: Secondary | ICD-10-CM | POA: Diagnosis not present

## 2018-12-26 DIAGNOSIS — I429 Cardiomyopathy, unspecified: Secondary | ICD-10-CM | POA: Diagnosis not present

## 2018-12-26 DIAGNOSIS — Z8249 Family history of ischemic heart disease and other diseases of the circulatory system: Secondary | ICD-10-CM | POA: Diagnosis not present

## 2018-12-26 DIAGNOSIS — Z86711 Personal history of pulmonary embolism: Secondary | ICD-10-CM | POA: Diagnosis not present

## 2018-12-26 DIAGNOSIS — J8410 Pulmonary fibrosis, unspecified: Secondary | ICD-10-CM | POA: Diagnosis not present

## 2018-12-26 DIAGNOSIS — Z87891 Personal history of nicotine dependence: Secondary | ICD-10-CM | POA: Diagnosis not present

## 2018-12-26 DIAGNOSIS — I2510 Atherosclerotic heart disease of native coronary artery without angina pectoris: Secondary | ICD-10-CM | POA: Diagnosis not present

## 2018-12-26 DIAGNOSIS — J439 Emphysema, unspecified: Secondary | ICD-10-CM | POA: Diagnosis not present

## 2018-12-26 DIAGNOSIS — I519 Heart disease, unspecified: Secondary | ICD-10-CM | POA: Diagnosis not present

## 2018-12-26 DIAGNOSIS — R0609 Other forms of dyspnea: Secondary | ICD-10-CM | POA: Diagnosis not present

## 2018-12-26 DIAGNOSIS — R600 Localized edema: Secondary | ICD-10-CM | POA: Diagnosis not present

## 2018-12-26 DIAGNOSIS — R5383 Other fatigue: Secondary | ICD-10-CM | POA: Diagnosis not present

## 2019-01-05 ENCOUNTER — Other Ambulatory Visit: Payer: Self-pay

## 2019-01-23 DIAGNOSIS — J84112 Idiopathic pulmonary fibrosis: Secondary | ICD-10-CM | POA: Diagnosis not present

## 2019-01-23 DIAGNOSIS — J449 Chronic obstructive pulmonary disease, unspecified: Secondary | ICD-10-CM | POA: Diagnosis not present

## 2019-03-07 DIAGNOSIS — N183 Chronic kidney disease, stage 3 (moderate): Secondary | ICD-10-CM | POA: Diagnosis not present

## 2019-03-07 DIAGNOSIS — J84112 Idiopathic pulmonary fibrosis: Secondary | ICD-10-CM | POA: Diagnosis not present

## 2019-05-02 ENCOUNTER — Other Ambulatory Visit: Payer: Self-pay

## 2019-05-21 DIAGNOSIS — R05 Cough: Secondary | ICD-10-CM | POA: Diagnosis not present

## 2019-05-21 DIAGNOSIS — J8410 Pulmonary fibrosis, unspecified: Secondary | ICD-10-CM | POA: Diagnosis not present

## 2019-05-21 DIAGNOSIS — R0600 Dyspnea, unspecified: Secondary | ICD-10-CM | POA: Diagnosis not present

## 2019-05-21 DIAGNOSIS — I519 Heart disease, unspecified: Secondary | ICD-10-CM | POA: Diagnosis not present

## 2019-05-21 DIAGNOSIS — I429 Cardiomyopathy, unspecified: Secondary | ICD-10-CM | POA: Diagnosis not present

## 2019-05-21 DIAGNOSIS — Z8249 Family history of ischemic heart disease and other diseases of the circulatory system: Secondary | ICD-10-CM | POA: Diagnosis not present

## 2019-05-21 DIAGNOSIS — R5383 Other fatigue: Secondary | ICD-10-CM | POA: Diagnosis not present

## 2019-05-21 DIAGNOSIS — Z86711 Personal history of pulmonary embolism: Secondary | ICD-10-CM | POA: Diagnosis not present

## 2019-05-21 DIAGNOSIS — E785 Hyperlipidemia, unspecified: Secondary | ICD-10-CM | POA: Diagnosis not present

## 2019-05-21 DIAGNOSIS — I2510 Atherosclerotic heart disease of native coronary artery without angina pectoris: Secondary | ICD-10-CM | POA: Diagnosis not present

## 2019-05-21 DIAGNOSIS — Z87891 Personal history of nicotine dependence: Secondary | ICD-10-CM | POA: Diagnosis not present

## 2019-05-21 DIAGNOSIS — J439 Emphysema, unspecified: Secondary | ICD-10-CM | POA: Diagnosis not present

## 2019-05-21 DIAGNOSIS — R600 Localized edema: Secondary | ICD-10-CM | POA: Diagnosis not present

## 2019-09-06 DEATH — deceased

## 2019-12-23 IMAGING — CT CT CHEST HIGH RESOLUTION W/O CM
2 of 5 series · 15 of 36 positions shown, 18 images · non-contrast
Comparison: Chest radiograph 07/12/2013.

CLINICAL DATA: Shortness of breath and increased cough.

EXAM:
CT CHEST WITHOUT CONTRAST
TECHNIQUE: Multidetector CT imaging of the chest was performed following the
standard protocol without intravenous contrast. High resolution
imaging of the lungs, as well as inspiratory and expiratory imaging,
was performed.

[Series 2: high resolution · axial · 0.70mm/px · z∈[-281,-1]mm · 12 of 156 slices shown, 15 images]
[im 8/156  mediastinal]
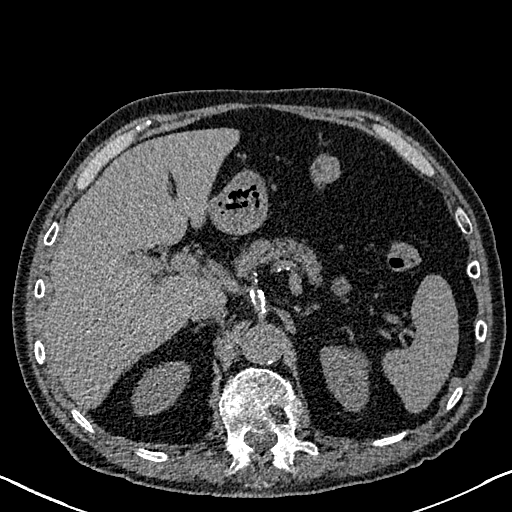
[im 8/156  lung]
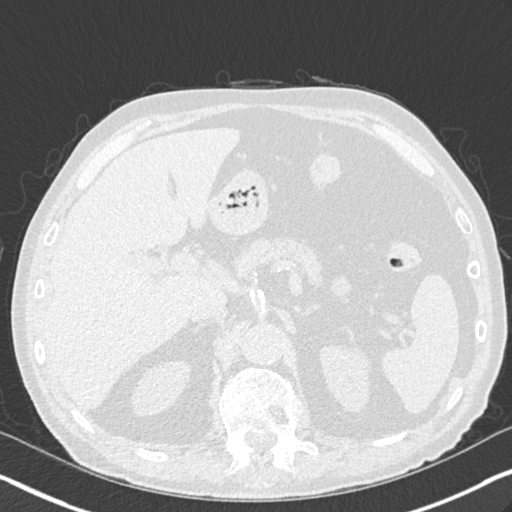
[im 23/156  lung]
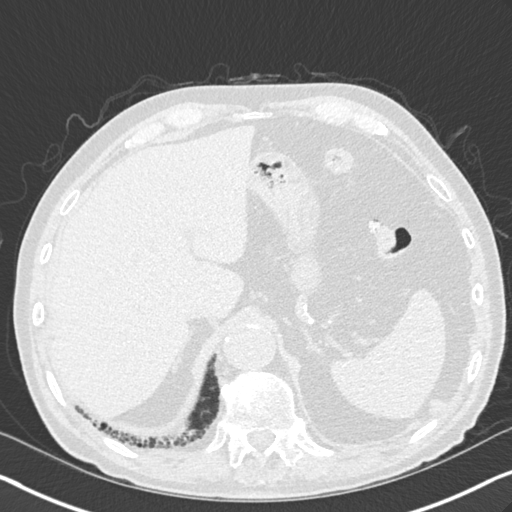
[im 37/156  lung]
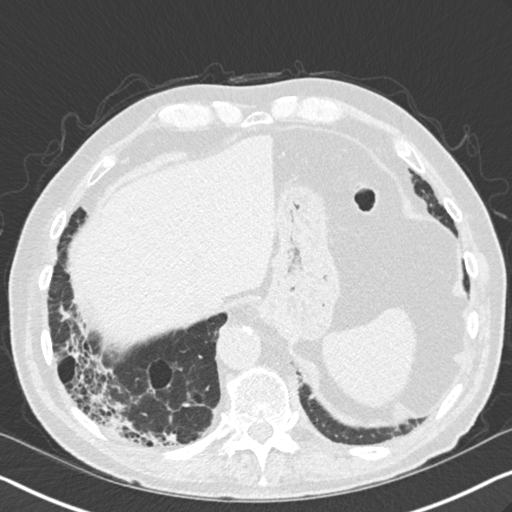
[im 45/156  lung]
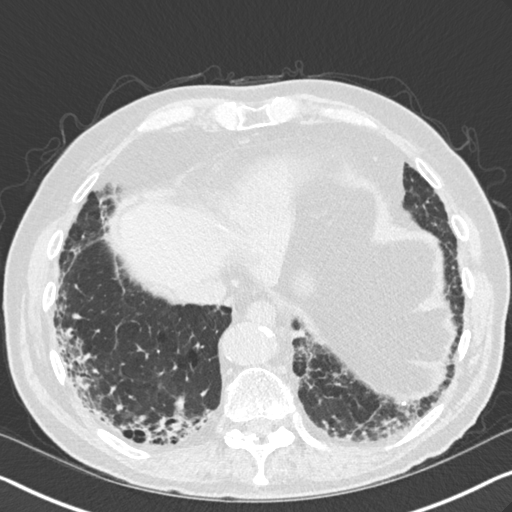
[im 60/156  mediastinal]
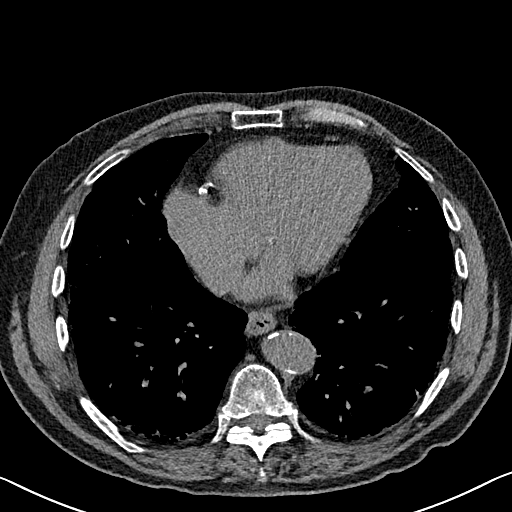
[im 60/156  lung]
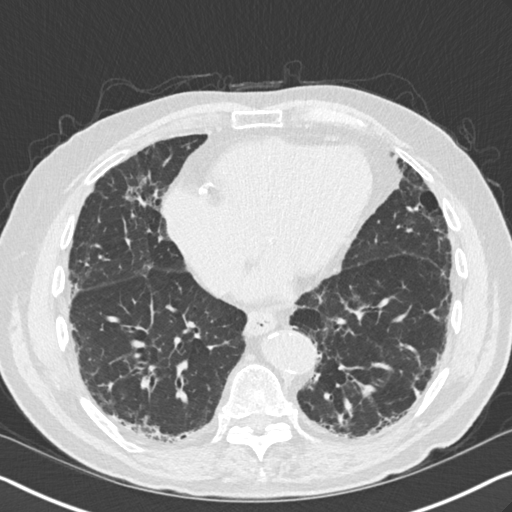
[im 74/156  lung]
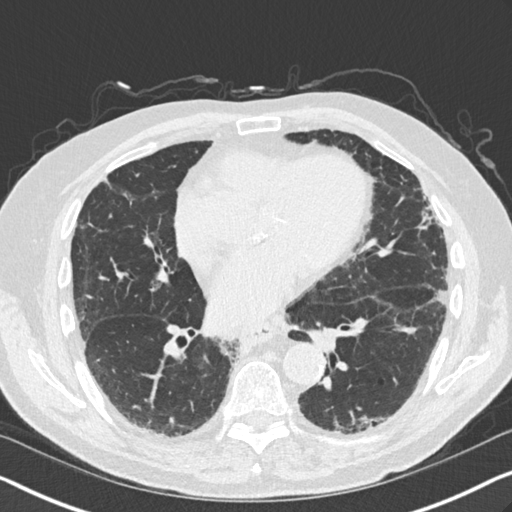
[im 82/156  lung]
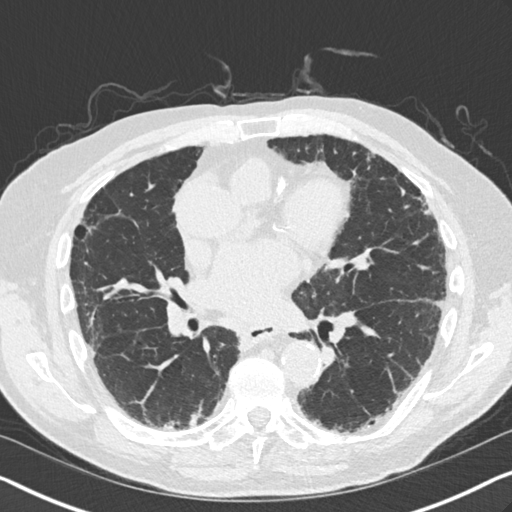
[im 96/156  lung]
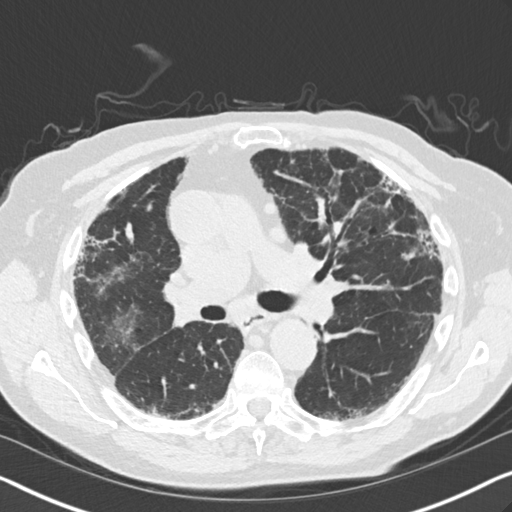
[im 111/156  mediastinal]
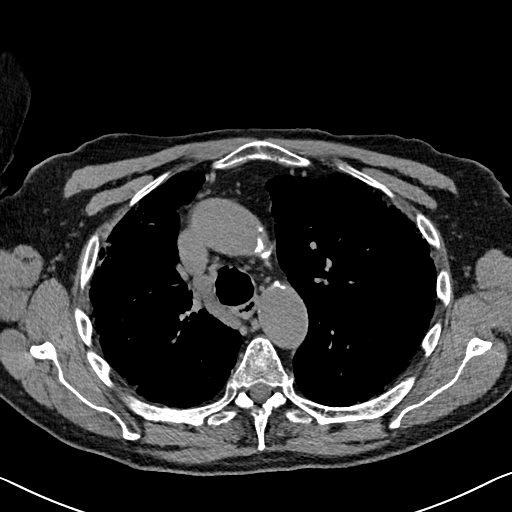
[im 111/156  lung]
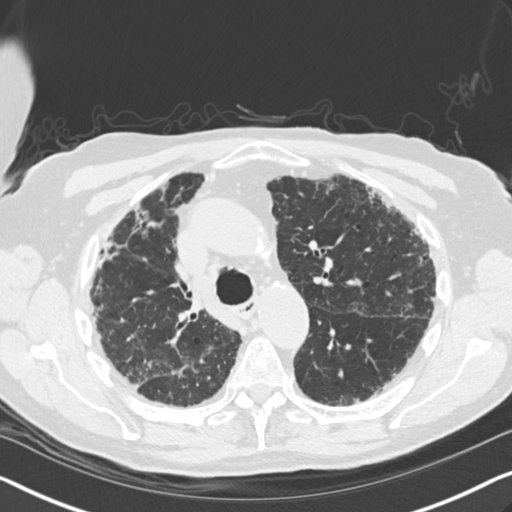
[im 119/156  lung]
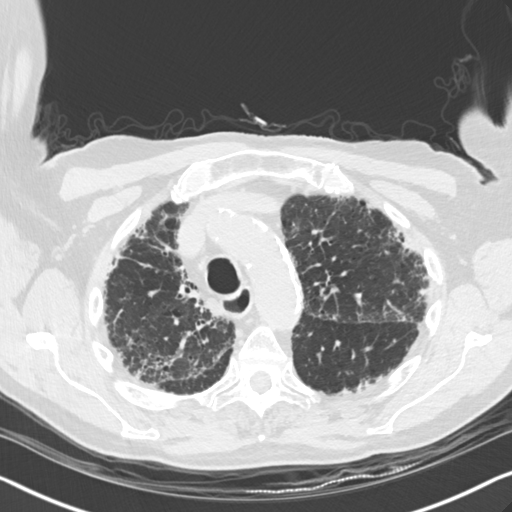
[im 133/156  lung]
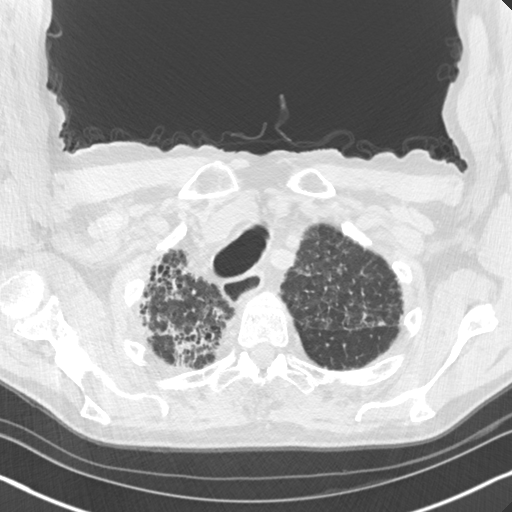
[im 148/156  lung]
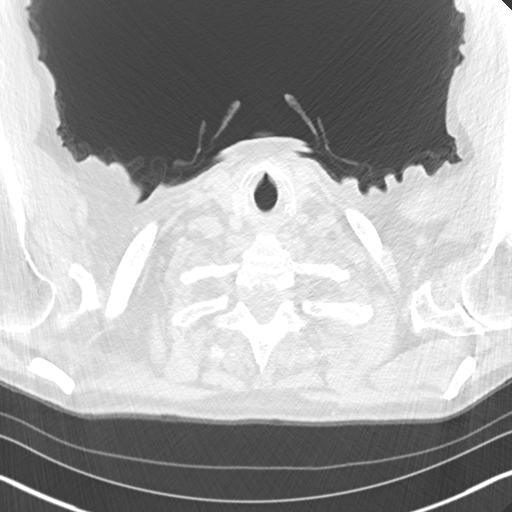

[Series 8: coronal · coronal · 0.63mm/px · 3 of 133 slices shown]
[im 27/133  lung]
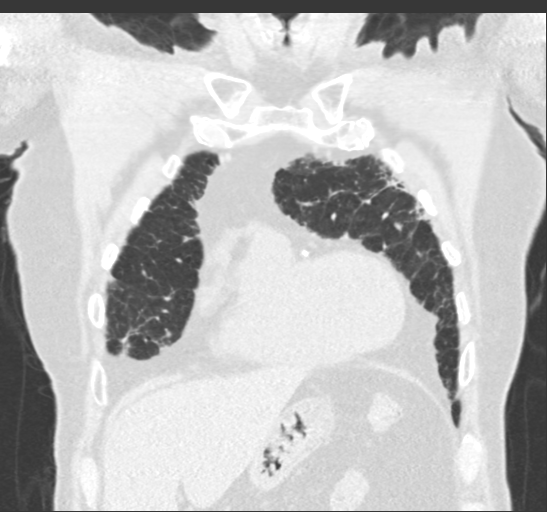
[im 53/133  lung]
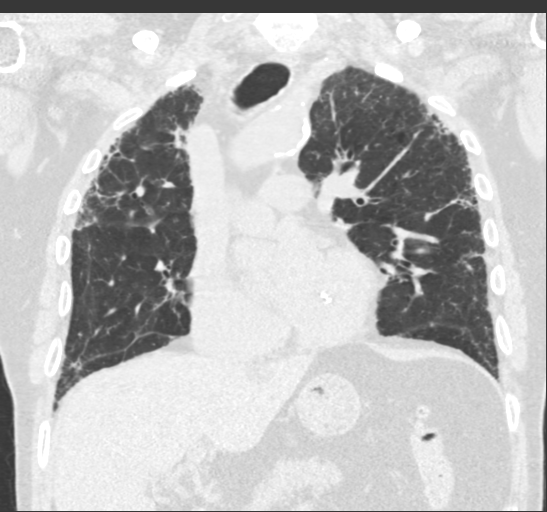
[im 80/133  lung]
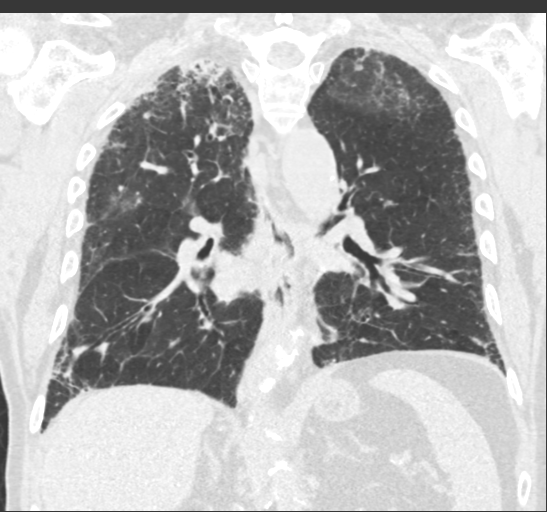

[15 of 36 positions shown; findings below may reference images not displayed]

FINDINGS: Cardiovascular: Atherosclerotic calcification of the arterial
vasculature, including severe three-vessel involvement of the
coronary arteries. A peripherally calcified outpouching along the
sinuses of Valsalva measures approximately 1.0 x 2.2 cm (series 2,
image 82). Heart size is at the upper limits of normal. No
pericardial effusion. Right and left pulmonary arteries appear
enlarged.

Mediastinum/Nodes: Mediastinal lymph nodes measure up to 11 mm in
the low AP window. Hilar regions are difficult to evaluate without
IV contrast. No axillary adenopathy. Esophagus is grossly
unremarkable.

Lungs/Pleura: Mildly basilar predominant subpleural reticulation,
traction bronchiectasis/bronchiolectasis and possible honeycombing.
Scattered pulmonary nodules measure 5 mm or less in size. No pleural
fluid. Airway is unremarkable. Expiratory imaging was not performed
in expiration, limiting evaluation of air trapping.

Upper Abdomen: Low-attenuation lesion in the dome of the liver
measures 2.4 cm and is difficult to further characterize without
post-contrast imaging. Visualized portions of the liver, adrenal
glands, kidneys, spleen, pancreas, stomach and bowel are otherwise
grossly unremarkable.

Musculoskeletal: Degenerative changes in the spine. No worrisome
lytic or sclerotic lesions. Lower thoracic/upper lumbar compression
fractures, likely old.
IMPRESSION: 1. Pulmonary parenchymal pattern of fibrosis is suspicious for usual
interstitial pneumonitis ("probable" UIP by Fleischner criteria,
[URL] or fibrotic
nonspecific interstitial pneumonitis.
2. Aortic atherosclerosis (IKOVN-170.0). Severe coronary artery
calcification.
3. Probable sinus of Valsalva aneurysm.
4. Pulmonary artery enlargement, indicative of pulmonary arterial
hypertension.
5. Pulmonary nodules measure up to 5 mm. No follow-up needed if
patient is low-risk (and has no known or suspected primary
neoplasm). Non-contrast chest CT can be considered in 12 months if
patient is high-risk. This recommendation follows the consensus
statement: Guidelines for Management of Incidental Pulmonary Nodules
Detected on CT Images: From the [HOSPITAL] 4491; Radiology
